# Patient Record
Sex: Male | Born: 1970 | Race: White | Hispanic: No | Marital: Single | Smoking: Never smoker
Health system: Southern US, Community
[De-identification: ages and names within clinical notes are randomized; demographics above are authoritative.]

## PROBLEM LIST (undated history)

## (undated) DIAGNOSIS — T8859XA Other complications of anesthesia, initial encounter: Secondary | ICD-10-CM

## (undated) DIAGNOSIS — K219 Gastro-esophageal reflux disease without esophagitis: Secondary | ICD-10-CM

## (undated) DIAGNOSIS — Z8489 Family history of other specified conditions: Secondary | ICD-10-CM

## (undated) DIAGNOSIS — Z8709 Personal history of other diseases of the respiratory system: Secondary | ICD-10-CM

## (undated) DIAGNOSIS — R064 Hyperventilation: Secondary | ICD-10-CM

## (undated) DIAGNOSIS — M109 Gout, unspecified: Secondary | ICD-10-CM

## (undated) DIAGNOSIS — I10 Essential (primary) hypertension: Secondary | ICD-10-CM

## (undated) DIAGNOSIS — T4145XA Adverse effect of unspecified anesthetic, initial encounter: Secondary | ICD-10-CM

## (undated) HISTORY — PX: ESOPHAGOGASTRODUODENOSCOPY: SHX1529

## (undated) HISTORY — PX: LARYNGOSCOPY: SUR817

## (undated) HISTORY — PX: BRONCHOSCOPY: SUR163

---

## 1997-09-19 ENCOUNTER — Emergency Department (HOSPITAL_COMMUNITY): Admission: EM | Admit: 1997-09-19 | Discharge: 1997-09-19 | Payer: Self-pay | Admitting: Emergency Medicine

## 1999-08-30 ENCOUNTER — Encounter: Admission: RE | Admit: 1999-08-30 | Discharge: 1999-08-30 | Payer: Self-pay | Admitting: Otolaryngology

## 1999-08-30 ENCOUNTER — Encounter: Payer: Self-pay | Admitting: Otolaryngology

## 1999-10-04 ENCOUNTER — Ambulatory Visit (HOSPITAL_COMMUNITY): Admission: RE | Admit: 1999-10-04 | Discharge: 1999-10-04 | Payer: Self-pay | Admitting: Gastroenterology

## 1999-10-17 ENCOUNTER — Ambulatory Visit (HOSPITAL_COMMUNITY): Admission: RE | Admit: 1999-10-17 | Discharge: 1999-10-17 | Payer: Self-pay | Admitting: Gastroenterology

## 2000-10-25 ENCOUNTER — Encounter: Payer: Self-pay | Admitting: Emergency Medicine

## 2000-10-25 ENCOUNTER — Emergency Department (HOSPITAL_COMMUNITY): Admission: EM | Admit: 2000-10-25 | Discharge: 2000-10-25 | Payer: Self-pay | Admitting: Emergency Medicine

## 2001-02-20 ENCOUNTER — Ambulatory Visit: Admission: RE | Admit: 2001-02-20 | Discharge: 2001-02-20 | Payer: Self-pay | Admitting: Internal Medicine

## 2001-05-19 ENCOUNTER — Ambulatory Visit (HOSPITAL_BASED_OUTPATIENT_CLINIC_OR_DEPARTMENT_OTHER): Admission: RE | Admit: 2001-05-19 | Discharge: 2001-05-19 | Payer: Self-pay | Admitting: Otolaryngology

## 2001-05-23 ENCOUNTER — Encounter: Payer: Self-pay | Admitting: Otolaryngology

## 2001-05-23 ENCOUNTER — Encounter: Admission: RE | Admit: 2001-05-23 | Discharge: 2001-05-23 | Payer: Self-pay | Admitting: Otolaryngology

## 2001-10-22 ENCOUNTER — Encounter: Admission: RE | Admit: 2001-10-22 | Discharge: 2001-10-22 | Payer: Self-pay | Admitting: Gastroenterology

## 2001-10-22 ENCOUNTER — Encounter: Payer: Self-pay | Admitting: Gastroenterology

## 2002-11-10 ENCOUNTER — Encounter: Payer: Self-pay | Admitting: Otolaryngology

## 2002-11-10 ENCOUNTER — Encounter: Admission: RE | Admit: 2002-11-10 | Discharge: 2002-11-10 | Payer: Self-pay | Admitting: Otolaryngology

## 2004-02-17 ENCOUNTER — Encounter: Admission: RE | Admit: 2004-02-17 | Discharge: 2004-02-17 | Payer: Self-pay | Admitting: Otolaryngology

## 2005-03-15 ENCOUNTER — Encounter: Admission: RE | Admit: 2005-03-15 | Discharge: 2005-03-15 | Payer: Self-pay | Admitting: Otolaryngology

## 2006-09-06 ENCOUNTER — Encounter: Admission: RE | Admit: 2006-09-06 | Discharge: 2006-09-06 | Payer: Self-pay | Admitting: Otolaryngology

## 2014-04-01 ENCOUNTER — Ambulatory Visit
Admission: RE | Admit: 2014-04-01 | Discharge: 2014-04-01 | Disposition: A | Discharge status: Home / Self Care | Payer: BC Managed Care – PPO | Source: Ambulatory Visit | Attending: Family Medicine | Admitting: Family Medicine

## 2014-04-01 ENCOUNTER — Other Ambulatory Visit: Payer: Self-pay | Admitting: Family Medicine

## 2014-04-01 DIAGNOSIS — R1013 Epigastric pain: Secondary | ICD-10-CM

## 2014-06-07 ENCOUNTER — Other Ambulatory Visit: Payer: Self-pay

## 2014-06-07 ENCOUNTER — Other Ambulatory Visit (HOSPITAL_COMMUNITY): Payer: BC Managed Care – PPO

## 2014-06-07 ENCOUNTER — Encounter (HOSPITAL_COMMUNITY)
Admission: RE | Admit: 2014-06-07 | Discharge: 2014-06-07 | Disposition: A | Discharge status: Home / Self Care | Payer: BLUE CROSS/BLUE SHIELD | Source: Ambulatory Visit | Attending: Otolaryngology | Admitting: Otolaryngology

## 2014-06-07 ENCOUNTER — Encounter (HOSPITAL_COMMUNITY): Payer: Self-pay

## 2014-06-07 ENCOUNTER — Other Ambulatory Visit: Payer: Self-pay | Admitting: Otolaryngology

## 2014-06-07 DIAGNOSIS — R490 Dysphonia: Secondary | ICD-10-CM | POA: Diagnosis not present

## 2014-06-07 DIAGNOSIS — J387 Other diseases of larynx: Secondary | ICD-10-CM | POA: Diagnosis not present

## 2014-06-07 DIAGNOSIS — M109 Gout, unspecified: Secondary | ICD-10-CM | POA: Diagnosis not present

## 2014-06-07 DIAGNOSIS — K219 Gastro-esophageal reflux disease without esophagitis: Secondary | ICD-10-CM | POA: Diagnosis not present

## 2014-06-07 DIAGNOSIS — I10 Essential (primary) hypertension: Secondary | ICD-10-CM | POA: Diagnosis not present

## 2014-06-07 DIAGNOSIS — J398 Other specified diseases of upper respiratory tract: Secondary | ICD-10-CM | POA: Diagnosis not present

## 2014-06-07 DIAGNOSIS — J386 Stenosis of larynx: Secondary | ICD-10-CM | POA: Diagnosis not present

## 2014-06-07 DIAGNOSIS — J3489 Other specified disorders of nose and nasal sinuses: Secondary | ICD-10-CM | POA: Diagnosis not present

## 2014-06-07 DIAGNOSIS — J42 Unspecified chronic bronchitis: Secondary | ICD-10-CM | POA: Diagnosis not present

## 2014-06-07 DIAGNOSIS — M3130 Wegener's granulomatosis without renal involvement: Secondary | ICD-10-CM | POA: Diagnosis not present

## 2014-06-07 HISTORY — DX: Family history of other specified conditions: Z84.89

## 2014-06-07 HISTORY — DX: Gout, unspecified: M10.9

## 2014-06-07 HISTORY — DX: Hyperventilation: R06.4

## 2014-06-07 HISTORY — DX: Adverse effect of unspecified anesthetic, initial encounter: T41.45XA

## 2014-06-07 HISTORY — DX: Other complications of anesthesia, initial encounter: T88.59XA

## 2014-06-07 HISTORY — DX: Gastro-esophageal reflux disease without esophagitis: K21.9

## 2014-06-07 HISTORY — DX: Personal history of other diseases of the respiratory system: Z87.09

## 2014-06-07 HISTORY — DX: Essential (primary) hypertension: I10

## 2014-06-07 NOTE — H&P (Signed)
Fox,  Vincent 44 y.o., male [id number]     Chief Complaint: hoarseness, stridor  HPI: 5 year return visit for this now 44-year-old white male.  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  The diagnosis of Wegener's disease has been considered but never confirmed.  We have attempted to open up his nose previously, unsuccessfully.     Here recently, he was involved with an educational seminar and was video recorded.  Upon watching his playback, he was impressed with the frequency and loudness of his breathing.  This was 3 months ago.  He continues to exercise vigorously, using the exercise bicycle for one full hour and getting his pulse rate up to 150.  He does not ever seem specifically stridorous.  No pain.  No cough.  Here recently, he has noticed a streak of blood occasionally when he clears his throat.  He has seen Dr. Ehinger everal times  and is sent here for further consideration.  A chest x-ray was clear.  He uses Protonix twice daily times years, and also allopurinol twice daily for gout.  No recent trauma to the neck or throat including intubation.  He has been on lisinopril with good control of hypertension per Dr. Ehinger for 2+ years.  In discussing lisinopril with him, he does recognize some similarities with the stretch receptor-type cough which this drug as noted for.  Nothing to suggest angioedema.  No recent upper respiratory infection.  He went scuba diving in the early summer with no difficulty whatsoever.     As we have discussed before, I would expect that eventually this will burn itself out.  He would like to have the nose open someday but recognizes that this may be fruitless if he still has an active process going on.  Preoperative visit.  We are planning on doing microlaryngoscopy with biopsy, possible laser ablation, possible steroid injection, possible balloon dilation of tracheal stenosis.  I discussed this with him  in detail including risks and complications.  Questions were answered and informed consent was obtained.  I gave him a prescription for hydrocodone for pain relief and also cough suppression.  He will continue his antireflux management.  We discussed advancement of diet and activity including return to work.   Since we saw him, he has stopped lisinopril x1 month.  His hypertension is actually better controlled and he feels like his breathing may be 15-20% better.  PMH: Past Medical History  Diagnosis Date  . Complication of anesthesia     slow to wake up  . Family history of adverse reaction to anesthesia     brother postop nausea and vomiting  . Hypertension     has been off of Lisinopril x  6wks   . Labored breathing     just to get air in but no shortness of breath;states this is why he is having this done  . History of bronchitis     late 90's  . Gout     takes Allopurinol daily  . GERD (gastroesophageal reflux disease)     takes Protonix daily    Surg Hx: Past Surgical History  Procedure Laterality Date  . Esophagogastroduodenoscopy    . Laryngoscopy    . Bronchoscopy      FHx:  No family history on file. SocHx:  reports that he has never smoked. He does not have any smokeless tobacco history on file. He reports that he drinks alcohol. He   reports that he does not use illicit drugs.  ALLERGIES: No Known Allergies   (Not in a hospital admission)  No results found for this or any previous visit (from the past 48 hour(s)). No results found.  ROS:Systemic: Not feeling tired (fatigue).  No fever, no night sweats, and no recent weight loss. Head: No headache. Eyes: No eye symptoms. Otolaryngeal: No hearing loss, no earache, no tinnitus, and no purulent nasal discharge.  No nasal passage blockage (stuffiness), no snoring, no sneezing, no hoarseness, and no sore throat. Cardiovascular: No chest pain or discomfort  and no palpitations. Pulmonary: Dyspnea, cough, and  wheezing. Gastrointestinal: No dysphagia  and no heartburn.  No nausea, no abdominal pain, and no melena.  No diarrhea. Genitourinary: No dysuria. Endocrine: No muscle weakness. Musculoskeletal: No calf muscle cramps, no arthralgias, and no soft tissue swelling. Neurological: No dizziness, no fainting, no tingling, and no numbness. Psychological: No anxiety  and no depression. Skin: No rash.  BP:130/76,  HR: 83 b/min,  Height: 5 ft 11 in, Weight: 265 lb , BMI: 37 kg/m2,   PHYSICAL EXAM: He appears healthy.  He is not hoarse or stridorous today.  Anterior nose is severely stenotic on both sides as before.  Oral cavity and pharynx clear.  Neck unremarkable.   Lungs: Clear to auscultation Heart: Regular rate and rhythm without murmurs Abdomen: Soft, active Extremities: Normal configuration Neurologic: Normal, grossly symmetric.  The flexible laryngoscope was introduced with difficulty owing to the severe nasal vestibular stenosis.  I was finally able to pass it down the RIGHT side.  Nasopharynx is clear.  Oral pharynx is clear.  He does have a strong gag reflex.  He has a band of what appears to be immature granulation tissue/scar between the arytenoids above the level of the true cords.  On the LEFT side, this continues to what appears to be a soft eschar on the false cord itself.  The cords are mobile and the airway is good.  What minimal view in the subglottic larynx and trachea I could obtain did not look particularly narrow or stenotic.  No pooling in valleculae or piriforms.    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  You can use the hydrocodone after surgery both for pain relief and also for cough suppression.  I would like to see you back  one month after surgery.  I will call you with the pathology report of course.  Hydrocodone-Acetaminophen 5-325  MG Oral Tablet;1-2 po q4-6h prn pain; Qty20; R0; Rx.  Casey Fye 06/07/2014, 5:49 PM     

## 2014-06-07 NOTE — Pre-Procedure Instructions (Signed)
Vincent Fox  06/07/2014   Your procedure is scheduled on:  Fri, Jan 29 @ 8:30 AM  [facility]  at 6:30 AM.  Call this number if you have problems the morning of surgery: [phone]   Remember:   Do not eat food or drink liquids after midnight.                No Goody's,BC's,Aleve,Aspirin,Ibuprofen,Fish Oil,or any Herbal Medications   Do not wear jewelry  Do not wear lotions, powders, or colognes. You may wear deodorant.  Men may shave face and neck.  Do not bring valuables to the hospital.  [facility] is not responsible                  for any belongings or valuables.               Contacts, dentures or bridgework may not be worn into surgery.  Leave suitcase in the car. After surgery it may be brought to your room.  For patients admitted to the hospital, discharge time is determined by your                treatment team.               Patients discharged the day of surgery will not be allowed to drive  home.    Special Instructions:  [name] - Preparing for Surgery  Before surgery, you can play an important role.  Because skin is not sterile, your skin needs to be as free of germs as possible.  You can reduce the number of germs on you skin by washing with CHG (chlorahexidine gluconate) soap before surgery.  CHG is an antiseptic cleaner which kills germs and bonds with the skin to continue killing germs even after washing.  Please DO NOT use if you have an allergy to CHG or antibacterial soaps.  If your skin becomes reddened/irritated stop using the CHG and inform your nurse when you arrive at Short Stay.  Do not shave (including legs and underarms) for at least 48 hours prior to the first CHG shower.  You may shave your face.  Please follow these instructions carefully:   1.  Shower with CHG Soap the night before surgery and the                                morning of Surgery.  2.  If you choose to wash your hair, wash your hair first as usual with  your       normal shampoo.  3.  After you shampoo, rinse your hair and body thoroughly to remove the                      Shampoo.  4.  Use CHG as you would any other liquid soap.  You can apply chg directly       to the skin and wash gently with scrungie or a clean washcloth.  5.  Apply the CHG Soap to your body ONLY FROM THE NECK DOWN.        Do not use on open wounds or open sores.  Avoid contact with your eyes,       ears, mouth and genitals (private parts).  Wash genitals (private parts)       with your normal soap.  6.  Wash thoroughly, paying special  attention to the area where your surgery        will be performed.  7.  Thoroughly rinse your body with warm water from the neck down.  8.  DO NOT shower/wash with your normal soap after using and rinsing off       the CHG Soap.  9.  Pat yourself dry with a clean towel.            10.  Wear clean pajamas.            11.  Place clean sheets on your bed the night of your first shower and do not        sleep with pets.  Day of Surgery  Do not apply any lotions/deoderants the morning of surgery.  Please wear clean clothes to the hospital/surgery center.     Please read over the following fact sheets that you were given: Pain Booklet, Coughing and Deep Breathing and Surgical Site Infection Prevention

## 2014-06-07 NOTE — Progress Notes (Addendum)
Medical MD is Dr.Robert Ehinger-labs done today at his office-requesting  CXR in epic from 04-01-14  Pt doesn't have a cardiologist  Denies EKG in past year  Denies ever having an echo/stress test/heart cath

## 2014-06-07 NOTE — Progress Notes (Signed)
   06/07/14 1128  OBSTRUCTIVE SLEEP APNEA  Have you ever been diagnosed with sleep apnea through a sleep study? No  Do you snore loudly (loud enough to be heard through closed doors)?  0  Do you often feel tired, fatigued, or sleepy during the daytime? 0  Has anyone observed you stop breathing during your sleep? 0  Do you have, or are you being treated for high blood pressure? 1  BMI more than 35 kg/m2? 1  Age over 46 years old? 0  Neck circumference greater than 40 cm/16 inches? 1 (18)  Gender: 1  Obstructive Sleep Apnea Score 4  Score 4 or greater  Results sent to PCP

## 2014-06-10 MED ORDER — CEFAZOLIN SODIUM 10 G IJ SOLR
3.0000 g | Freq: Once | INTRAMUSCULAR | Status: AC
  Administered 2014-06-11: 3 g via INTRAVENOUS
  Filled 2014-06-10: qty 3000

## 2014-06-10 MED ORDER — DEXAMETHASONE SODIUM PHOSPHATE 10 MG/ML IJ SOLN
8.0000 mg | Freq: Once | INTRAMUSCULAR | Status: AC
  Administered 2014-06-11: 8 mg via INTRAVENOUS
  Filled 2014-06-10: qty 1

## 2014-06-10 MED ORDER — PANTOPRAZOLE SODIUM 40 MG IV SOLR
40.0000 mg | Freq: Once | INTRAVENOUS | Status: DC
  Filled 2014-06-10 (×2): qty 40

## 2014-06-10 NOTE — Anesthesia Preprocedure Evaluation (Addendum)
Anesthesia Evaluation  Patient identified by MRN, date of birth, ID band Patient awake    Reviewed: Allergy & Precautions, NPO status , Patient's Chart, lab work & pertinent test results, reviewed documented beta blocker date and time   Airway        Dental   Pulmonary  Possible tracheal stenosis, CXR OK         Cardiovascular hypertension,  EKG 06/07/2014 occasional PVC, otherwise WNL   Neuro/Psych negative neurological ROS  negative psych ROS   GI/Hepatic Neg liver ROS, GERD-  Medicated,  Endo/Other  negative endocrine ROS  Renal/GU negative Renal ROS     Musculoskeletal negative musculoskeletal ROS (+)   Abdominal   Peds  Hematology negative hematology ROS (+)   Anesthesia Other Findings Hx of nasal obstruction, breathing about 20% better now that he is off lisinopril  Reproductive/Obstetrics                            Anesthesia Physical Anesthesia Plan  ASA: II  Anesthesia Plan: General   Post-op Pain Management:    Induction: Intravenous  Airway Management Planned: Oral ETT  Additional Equipment:   Intra-op Plan:   Post-operative Plan: Extubation in OR  Informed Consent: I have reviewed the patients History and Physical, chart, labs and discussed the procedure including the risks, benefits and alternatives for the proposed anesthesia with the patient or authorized representative who has indicated his/her understanding and acceptance.     Plan Discussed with:   Anesthesia Plan Comments: (Lazer tube available)        Anesthesia Quick Evaluation

## 2014-06-11 ENCOUNTER — Ambulatory Visit (HOSPITAL_COMMUNITY)
Admission: RE | Admit: 2014-06-11 | Discharge: 2014-06-11 | Disposition: A | Discharge status: Home / Self Care | Payer: BLUE CROSS/BLUE SHIELD | Source: Ambulatory Visit | Attending: Otolaryngology | Admitting: Otolaryngology

## 2014-06-11 ENCOUNTER — Ambulatory Visit (HOSPITAL_COMMUNITY): Payer: BLUE CROSS/BLUE SHIELD | Admitting: Anesthesiology

## 2014-06-11 ENCOUNTER — Encounter (HOSPITAL_COMMUNITY): Payer: Self-pay | Admitting: *Deleted

## 2014-06-11 ENCOUNTER — Encounter (HOSPITAL_COMMUNITY)
Admission: RE | Disposition: A | Discharge status: Home / Self Care | Payer: Self-pay | Source: Ambulatory Visit | Attending: Otolaryngology

## 2014-06-11 DIAGNOSIS — J3489 Other specified disorders of nose and nasal sinuses: Secondary | ICD-10-CM | POA: Insufficient documentation

## 2014-06-11 DIAGNOSIS — J386 Stenosis of larynx: Secondary | ICD-10-CM | POA: Diagnosis not present

## 2014-06-11 DIAGNOSIS — R490 Dysphonia: Secondary | ICD-10-CM | POA: Insufficient documentation

## 2014-06-11 DIAGNOSIS — J387 Other diseases of larynx: Secondary | ICD-10-CM | POA: Insufficient documentation

## 2014-06-11 DIAGNOSIS — J42 Unspecified chronic bronchitis: Secondary | ICD-10-CM | POA: Insufficient documentation

## 2014-06-11 DIAGNOSIS — M109 Gout, unspecified: Secondary | ICD-10-CM | POA: Insufficient documentation

## 2014-06-11 DIAGNOSIS — K219 Gastro-esophageal reflux disease without esophagitis: Secondary | ICD-10-CM | POA: Insufficient documentation

## 2014-06-11 DIAGNOSIS — J398 Other specified diseases of upper respiratory tract: Secondary | ICD-10-CM | POA: Insufficient documentation

## 2014-06-11 DIAGNOSIS — M3130 Wegener's granulomatosis without renal involvement: Secondary | ICD-10-CM | POA: Insufficient documentation

## 2014-06-11 DIAGNOSIS — I10 Essential (primary) hypertension: Secondary | ICD-10-CM | POA: Insufficient documentation

## 2014-06-11 HISTORY — PX: MICROLARYNGOSCOPY WITH LASER AND BALLOON DILATION: SHX5973

## 2014-06-11 LAB — CBC
HCT: 40.8 % (ref 39.0–52.0)
Hemoglobin: 13.6 g/dL (ref 13.0–17.0)
MCH: 28.5 pg (ref 26.0–34.0)
MCHC: 33.3 g/dL (ref 30.0–36.0)
MCV: 85.4 fL (ref 78.0–100.0)
PLATELETS: 198 10*3/uL (ref 150–400)
RBC: 4.78 MIL/uL (ref 4.22–5.81)
RDW: 13.1 % (ref 11.5–15.5)
WBC: 5.7 10*3/uL (ref 4.0–10.5)

## 2014-06-11 SURGERY — MICROLARYNGOSCOPY WITH LASER AND BALLOON DILATION
Anesthesia: General

## 2014-06-11 MED ORDER — EPINEPHRINE HCL 1 MG/ML IJ SOLN
INTRAMUSCULAR | Status: AC
  Filled 2014-06-11: qty 1

## 2014-06-11 MED ORDER — TRIAMCINOLONE ACETONIDE 40 MG/ML IJ SUSP
INTRAMUSCULAR | Status: AC
  Filled 2014-06-11: qty 5

## 2014-06-11 MED ORDER — PROPOFOL INFUSION 10 MG/ML OPTIME
INTRAVENOUS | Status: DC | PRN
  Administered 2014-06-11: 100 ug/kg/min via INTRAVENOUS

## 2014-06-11 MED ORDER — FENTANYL CITRATE 0.05 MG/ML IJ SOLN
INTRAMUSCULAR | Status: DC | PRN
  Administered 2014-06-11 (×2): 50 ug via INTRAVENOUS
  Administered 2014-06-11: 100 ug via INTRAVENOUS
  Administered 2014-06-11: 50 ug via INTRAVENOUS

## 2014-06-11 MED ORDER — PROPOFOL 10 MG/ML IV BOLUS
INTRAVENOUS | Status: DC | PRN
  Administered 2014-06-11: 180 mg via INTRAVENOUS

## 2014-06-11 MED ORDER — PROPOFOL 10 MG/ML IV BOLUS
INTRAVENOUS | Status: AC
  Filled 2014-06-11: qty 20

## 2014-06-11 MED ORDER — FENTANYL CITRATE 0.05 MG/ML IJ SOLN
INTRAMUSCULAR | Status: AC
  Filled 2014-06-11: qty 5

## 2014-06-11 MED ORDER — LACTATED RINGERS IV SOLN
INTRAVENOUS | Status: DC
  Administered 2014-06-11: 08:00:00 via INTRAVENOUS

## 2014-06-11 MED ORDER — MIDAZOLAM HCL 5 MG/5ML IJ SOLN
INTRAMUSCULAR | Status: DC | PRN
  Administered 2014-06-11 (×2): 1 mg via INTRAVENOUS

## 2014-06-11 MED ORDER — MIDAZOLAM HCL 2 MG/2ML IJ SOLN
INTRAMUSCULAR | Status: AC
  Filled 2014-06-11: qty 2

## 2014-06-11 MED ORDER — OXYMETAZOLINE HCL 0.05 % NA SOLN
NASAL | Status: AC
  Filled 2014-06-11: qty 15

## 2014-06-11 MED ORDER — SUCCINYLCHOLINE CHLORIDE 20 MG/ML IJ SOLN
INTRAMUSCULAR | Status: DC | PRN
  Administered 2014-06-11: 25 mg via INTRAVENOUS
  Administered 2014-06-11: 100 mg via INTRAVENOUS
  Administered 2014-06-11: 25 mg via INTRAVENOUS

## 2014-06-11 MED ORDER — LACTATED RINGERS IV SOLN
INTRAVENOUS | Status: DC | PRN
  Administered 2014-06-11 (×2): via INTRAVENOUS

## 2014-06-11 MED ORDER — FENTANYL CITRATE 0.05 MG/ML IJ SOLN: 25.0000 ug | INTRAMUSCULAR | Status: DC | PRN

## 2014-06-11 MED ORDER — PROMETHAZINE HCL 25 MG/ML IJ SOLN: 6.2500 mg | INTRAMUSCULAR | Status: DC | PRN

## 2014-06-11 MED ORDER — OXYMETAZOLINE HCL 0.05 % NA SOLN
NASAL | Status: DC | PRN
  Administered 2014-06-11: 1

## 2014-06-11 MED ORDER — TRIAMCINOLONE ACETONIDE 40 MG/ML IJ SUSP
INTRAMUSCULAR | Status: DC | PRN
  Administered 2014-06-11: 200 mg

## 2014-06-11 MED ORDER — ONDANSETRON HCL 4 MG/2ML IJ SOLN
INTRAMUSCULAR | Status: DC | PRN
  Administered 2014-06-11: 4 mg via INTRAVENOUS

## 2014-06-11 MED ORDER — LIDOCAINE HCL (CARDIAC) 20 MG/ML IV SOLN
INTRAVENOUS | Status: AC
  Filled 2014-06-11: qty 5

## 2014-06-11 MED ORDER — 0.9 % SODIUM CHLORIDE (POUR BTL) OPTIME
TOPICAL | Status: DC | PRN
  Administered 2014-06-11: 1000 mL

## 2014-06-11 MED ORDER — MEPERIDINE HCL 25 MG/ML IJ SOLN: 6.2500 mg | INTRAMUSCULAR | Status: DC | PRN

## 2014-06-11 MED ORDER — LIDOCAINE HCL (CARDIAC) 20 MG/ML IV SOLN
INTRAVENOUS | Status: DC | PRN
  Administered 2014-06-11: 50 mg via INTRAVENOUS

## 2014-06-11 SURGICAL SUPPLY — 36 items
BIT DRILL QC 2.7MM (BIT) ×1 IMPLANT
CANISTER SUCT 1200ML W/VALVE (MISCELLANEOUS) IMPLANT
COVER BACK TABLE 80X110 HD (DRAPES) ×3 IMPLANT
COVER MAYO STAND STRL (DRAPES) ×3 IMPLANT
DRAPE PROXIMA HALF (DRAPES) ×3 IMPLANT
GAUZE SPONGE 2X2 8PLY STRL LF (GAUZE/BANDAGES/DRESSINGS) ×1 IMPLANT
GAUZE SPONGE 4X4 16PLY XRAY LF (GAUZE/BANDAGES/DRESSINGS) ×3 IMPLANT
GLOVE BIO SURGEON STRL SZ7.5 (GLOVE) ×3 IMPLANT
GLOVE BIO SURGEON STRL SZ8 (GLOVE) ×3 IMPLANT
GLOVE BIOGEL PI IND STRL 7.5 (GLOVE) ×1 IMPLANT
GLOVE BIOGEL PI IND STRL 8 (GLOVE) ×1 IMPLANT
GLOVE BIOGEL PI INDICATOR 7.5 (GLOVE) ×2
GLOVE BIOGEL PI INDICATOR 8 (GLOVE) ×2
GLOVE ECLIPSE 8.0 STRL XLNG CF (GLOVE) ×3 IMPLANT
GOWN STRL REUS W/ TWL XL LVL3 (GOWN DISPOSABLE) ×2 IMPLANT
GOWN STRL REUS W/TWL 2XL LVL3 (GOWN DISPOSABLE) ×3 IMPLANT
GOWN STRL REUS W/TWL XL LVL3 (GOWN DISPOSABLE) ×4
GUARD TEETH (MISCELLANEOUS) ×3 IMPLANT
MARKER SKIN DUAL TIP RULER LAB (MISCELLANEOUS) IMPLANT
NEEDLE 1/2 CIR CATGUT .05X1.09 (NEEDLE) IMPLANT
NEEDLE 18GX1X1/2 (RX/OR ONLY) (NEEDLE) ×3 IMPLANT
NEEDLE SPNL 22GX3.5 QUINCKE BK (NEEDLE) IMPLANT
NEEDLE TRANS ORAL INJECTION (NEEDLE) IMPLANT
NS IRRIG 1000ML POUR BTL (IV SOLUTION) ×3 IMPLANT
PAD EYE OVAL STERILE LF (GAUZE/BANDAGES/DRESSINGS) IMPLANT
PATTIES SURGICAL .5 X3 (DISPOSABLE) ×3 IMPLANT
PROS DRILL BIT QC 2.7MM (BIT) ×3
SOLUTION ANTI FOG 6CC (MISCELLANEOUS) ×3 IMPLANT
SOLUTION BUTLER CLEAR DIP (MISCELLANEOUS) ×3 IMPLANT
SPONGE GAUZE 2X2 STER 10/PKG (GAUZE/BANDAGES/DRESSINGS) ×2
SPONGE GAUZE 4X4 16PLY UNSTER (WOUND CARE) IMPLANT
SYR 5ML LL (SYRINGE) ×3 IMPLANT
SYR INFLATE BILIARY GAUGE (MISCELLANEOUS) ×3 IMPLANT
TOWEL OR 17X24 6PK STRL BLUE (TOWEL DISPOSABLE) ×3 IMPLANT
TUBE CONNECTING 20'X1/4 (TUBING) ×1
TUBE CONNECTING 20X1/4 (TUBING) ×2 IMPLANT

## 2014-06-11 NOTE — Anesthesia Postprocedure Evaluation (Signed)
  Anesthesia Post-op Note  Patient: Vincent Fox  Procedure(s) Performed: Procedure(s): MICROLARYNGOSCOPY DIRECT WITH LASER/BRONCHOSCOPY AND  BALLOON DILATION (N/A)  Patient Location: PACU  Anesthesia Type:General  Level of Consciousness: awake and alert   Airway and Oxygen Therapy: Patient Spontanous Breathing and Patient connected to nasal cannula oxygen  Post-op Pain: mild  Post-op Assessment: Post-op Vital signs reviewed and Patient's Cardiovascular Status Stable  Post-op Vital Signs: Reviewed and stable  Last Vitals:  Filed Vitals:   06/11/14 0644  BP: 153/105  Pulse: 86  Temp: 36.4 C  Resp: 20    Complications: No apparent anesthesia complications

## 2014-06-11 NOTE — H&P (View-Only) (Signed)
Vincent Fox,  Vincent Fox 44 y.o., male [id number]     Chief Complaint: hoarseness, stridor  HPI: 5 year return visit for this now 44 year old white male.  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  The diagnosis of Wegener's disease has been considered but never confirmed.  We have attempted to open up his nose previously, unsuccessfully.     Here recently, he was involved with an Conservation officer, historic buildingseducational seminar and was video recorded.  Upon watching his playback, he was impressed with the frequency and loudness of his breathing.  This was 3 months ago.  He continues to exercise vigorously, using the exercise bicycle for one full hour and getting his pulse rate up to 150.  He does not ever seem specifically stridorous.  No pain.  No cough.  Here recently, he has noticed a streak of blood occasionally when he clears his throat.  He has seen Dr. Manus Fox everal times  and is sent here for further consideration.  A chest x-ray was clear.  He uses Protonix twice daily times years, and also allopurinol twice daily for gout.  No recent trauma to the neck or throat including intubation.  He has been on lisinopril with good control of hypertension per Dr. Manus Fox for 2+ years.  In discussing lisinopril with him, he does recognize some similarities with the stretch receptor-type cough which this drug as noted for.  Nothing to suggest angioedema.  No recent upper respiratory infection.  He went scuba diving in the early summer with no difficulty whatsoever.     As we have discussed before, I would expect that eventually this will burn itself out.  He would like to have the nose open someday but recognizes that this may be fruitless if he still has an active process going on.  Preoperative visit.  We are planning on doing microlaryngoscopy with biopsy, possible laser ablation, possible steroid injection, possible balloon dilation of tracheal stenosis.  I discussed this with him  in detail including risks and complications.  Questions were answered and informed consent was obtained.  I gave him a prescription for hydrocodone for pain relief and also cough suppression.  He will continue his antireflux management.  We discussed advancement of diet and activity including return to work.   Since we saw him, he has stopped lisinopril x1 month.  His hypertension is actually better controlled and he feels like his breathing may be 15-20% better.  PMH: Past Medical History  Diagnosis Date  . Complication of anesthesia     slow to wake up  . Family history of adverse reaction to anesthesia     brother postop nausea and vomiting  . Hypertension     has been off of Lisinopril x  6wks   . Labored breathing     just to get air in but no shortness of breath;states this is why he is having this done  . History of bronchitis     late 90's  . Gout     takes Allopurinol daily  . GERD (gastroesophageal reflux disease)     takes Protonix daily    Surg Hx: Past Surgical History  Procedure Laterality Date  . Esophagogastroduodenoscopy    . Laryngoscopy    . Bronchoscopy      FHx:  No family history on file. SocHx:  reports that he has never smoked. He does not have any smokeless tobacco history on file. He reports that he drinks alcohol. He  reports that he does not use illicit drugs.  ALLERGIES: No Known Allergies   (Not in a hospital admission)  No results found for this or any previous visit (from the past 48 hour(s)). No results found.  [id number]: Not feeling tired (fatigue).  No fever, no night sweats, and no recent weight loss. Head: No headache. Eyes: No eye symptoms. Otolaryngeal: No hearing loss, no earache, no tinnitus, and no purulent nasal discharge.  No nasal passage blockage (stuffiness), no snoring, no sneezing, no hoarseness, and no sore throat. Cardiovascular: No chest pain or discomfort  and no palpitations. Pulmonary: Dyspnea, cough, and  wheezing. Gastrointestinal: No dysphagia  and no heartburn.  No nausea, no abdominal pain, and no melena.  No diarrhea. Genitourinary: No dysuria. Endocrine: No muscle weakness. Musculoskeletal: No calf muscle cramps, no arthralgias, and no soft tissue swelling. Neurological: No dizziness, no fainting, no tingling, and no numbness. Psychological: No anxiety  and no depression. Skin: No rash.  BP:130/76,  HR: 83 b/min,  Height: 5 ft 11 in, Weight: 265 lb , BMI: 37 kg/m2,   PHYSICAL EXAM: He appears healthy.  He is not hoarse or stridorous today.  Anterior nose is severely stenotic on both sides as before.  Oral cavity and pharynx clear.  Neck unremarkable.   Lungs: Clear to auscultation Heart: Regular rate and rhythm without murmurs Abdomen: Soft, active Extremities: Normal configuration Neurologic: Normal, grossly symmetric.  The flexible laryngoscope was introduced with difficulty owing to the severe nasal vestibular stenosis.  I was finally able to pass it down the RIGHT side.  Nasopharynx is clear.  Oral pharynx is clear.  He does have a strong gag reflex.  He has a band of what appears to be immature granulation tissue/scar between the arytenoids above the level of the true cords.  On the LEFT side, this continues to what appears to be a soft eschar on the false cord itself.  The cords are mobile and the airway is good.  What minimal view in the subglottic larynx and trachea I could obtain did not look particularly narrow or stenotic.  No pooling in valleculae or piriforms.    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  You can use the hydrocodone after surgery both for pain relief and also for cough suppression.  I would like to see you back  one month after surgery.  I will call you with the pathology report of course.  Hydrocodone-Acetaminophen 5-325  MG Oral Tablet;1-2 po q4-6h prn pain; Qty20; R0; Rx.  Vincent Fox 06/07/2014, 5:49 PM

## 2014-06-11 NOTE — Op Note (Signed)
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  Tracheal stenosis approximately 60% of normal diameter (8 mm) 4 cm below the vocal cords.  Procedure: With the patient in a comfortable supine position, general mask and intravenous anesthesia was induced without difficulty.  At an appropmriate level, the table was turned 90 degrees away from Anesthesia.  A clean preparation and draping was performed in the standard fashion.  A rubber tooth guard was placed.   taking care to protect lips and teeth a laser laryngoscope was introduced but was too large to pass into the supraglottis and was removed.  The anterior commissure laryngoscope was introduced into the supraglottis and suspended in the standard fashion.    The findings were as described above. Kenalog solution, 40 mg per mL was injected into the posterior commissure web.  The rigid bronchoscopic telescope was introduced through the laryngoscope and passed into the upper trachea with the findings as described above.  the membranous stenosis was injected with additional Kenalog solution.    All personnel in the room had protective eye gear. The laser had been previously tested for aim and function.  The patient was draped in saline saturated towels including saline saturated eye pads.  Using the laser at a 10 W single pulse setting, the posterior web was incised in the midline. This was carried down to the level of the posterior tracheal wall. A biopsy was taken from the mucosa and sent for permanent interpretation. An Afrin pledget was  applied against the raw surfaces for hemostasis and allowed to remain in place for several minutes.  The pledget was removed and hemostasis was observed.  Bronchoscopic telescope was reintroduced and the stenosis was again examined and measured. A 15 mm balloon was applied at the stenosis and inflated to 7  Atmospheres.  It did develop a leak  and was removed. The stenosis was felt to have been adequately dilated and this was not repeated.  There was no bleeding at this site.  Small amount of blood was suctioned from the inferior trachea which was clear all the way to the level of the carina.  The posterior commissure was once again hemostatic.  The laryngoscope was then suspended and removed.  At this point the procedure was completed.  Dental status was intact.  The patient was returned to Anesthesia, awakened, extubated, and transferred to PACU in satisfactory condition.   Dispo:   PACU to home  Plan:  Moisture, hydration, antireflux measures, hydrocodone for pain and/or cough suppression.  Cephus RicherWOLICKI,  Capitola Ladson T MD

## 2014-06-11 NOTE — Progress Notes (Signed)
Report given to angel britt rn as cargiver 

## 2014-06-11 NOTE — Transfer of Care (Signed)
Immediate Anesthesia Transfer of Care Note  Patient: Vincent Fox L Derick  Procedure(s) Performed: Procedure(s): MICROLARYNGOSCOPY DIRECT WITH LASER/BRONCHOSCOPY AND  BALLOON DILATION (N/A)  Patient Location: PACU  Anesthesia Type:General  Level of Consciousness: awake, alert  and oriented  Airway & Oxygen Therapy: Patient Spontanous Breathing and Patient connected to face mask oxygen  Post-op Assessment: Report given to RN and Post -op Vital signs reviewed and stable  Post vital signs: Reviewed and stable  Last Vitals:  Filed Vitals:   06/11/14 0644  BP: 153/105  Pulse: 86  Temp: 36.4 C  Resp: 20    Complications: No apparent anesthesia complications

## 2014-06-11 NOTE — Discharge Instructions (Signed)
Advance diet and activity as comfortable Keep your environment moist. Breathe the hot steam in the shower 3-4 times daily for 15 minutes at a time and as desired.  Drink plenty of fluids Hydrocodone for pain relief and also for cough suppression as needed. Avoid heavy coughing Continue anti reflux medication twice daily before meals Recheck my office 3 weeks please, [phone] Call for bleeding, breathing problems, excessive pain.

## 2014-06-11 NOTE — Interval H&P Note (Signed)
History and Physical Interval Note:  06/11/2014 8:28 AM  Vincent Fox  has presented today for surgery, with the diagnosis of laryngeal stenosis  The various methods of treatment have been discussed with the patient and family. After consideration of risks, benefits and other options for treatment, the patient has consented to  Procedure(s): MICROLARYNGOSCOPY DIRECT WITH LASER/BRONCHOSCOPY AND POSSIBLE BALLOON DILATION (N/A) as a surgical intervention .  The patient's history has been re-reviewed, patient re-examined, no change in status, stable for surgery.  I have re-reviewed the patient's chart and labs.  Questions were answered to the patient's satisfaction.     Flo ShanksWOLICKI, Ashritha [place]rosiers

## 2014-06-14 ENCOUNTER — Encounter (HOSPITAL_COMMUNITY): Payer: Self-pay | Admitting: Otolaryngology

## 2014-06-14 NOTE — Progress Notes (Signed)
Patient has pain in lower leg after anesthesia.

## 2015-04-17 IMAGING — CR DG CHEST 2V
2 series · 2 of 2 positions shown · non-contrast
Comparison: 09/06/2006

CLINICAL DATA: Dyspepsia. Dyspnea for 4 months. History of
Oncet disease.

EXAM:
CHEST  2 VIEW

[w chest pa]
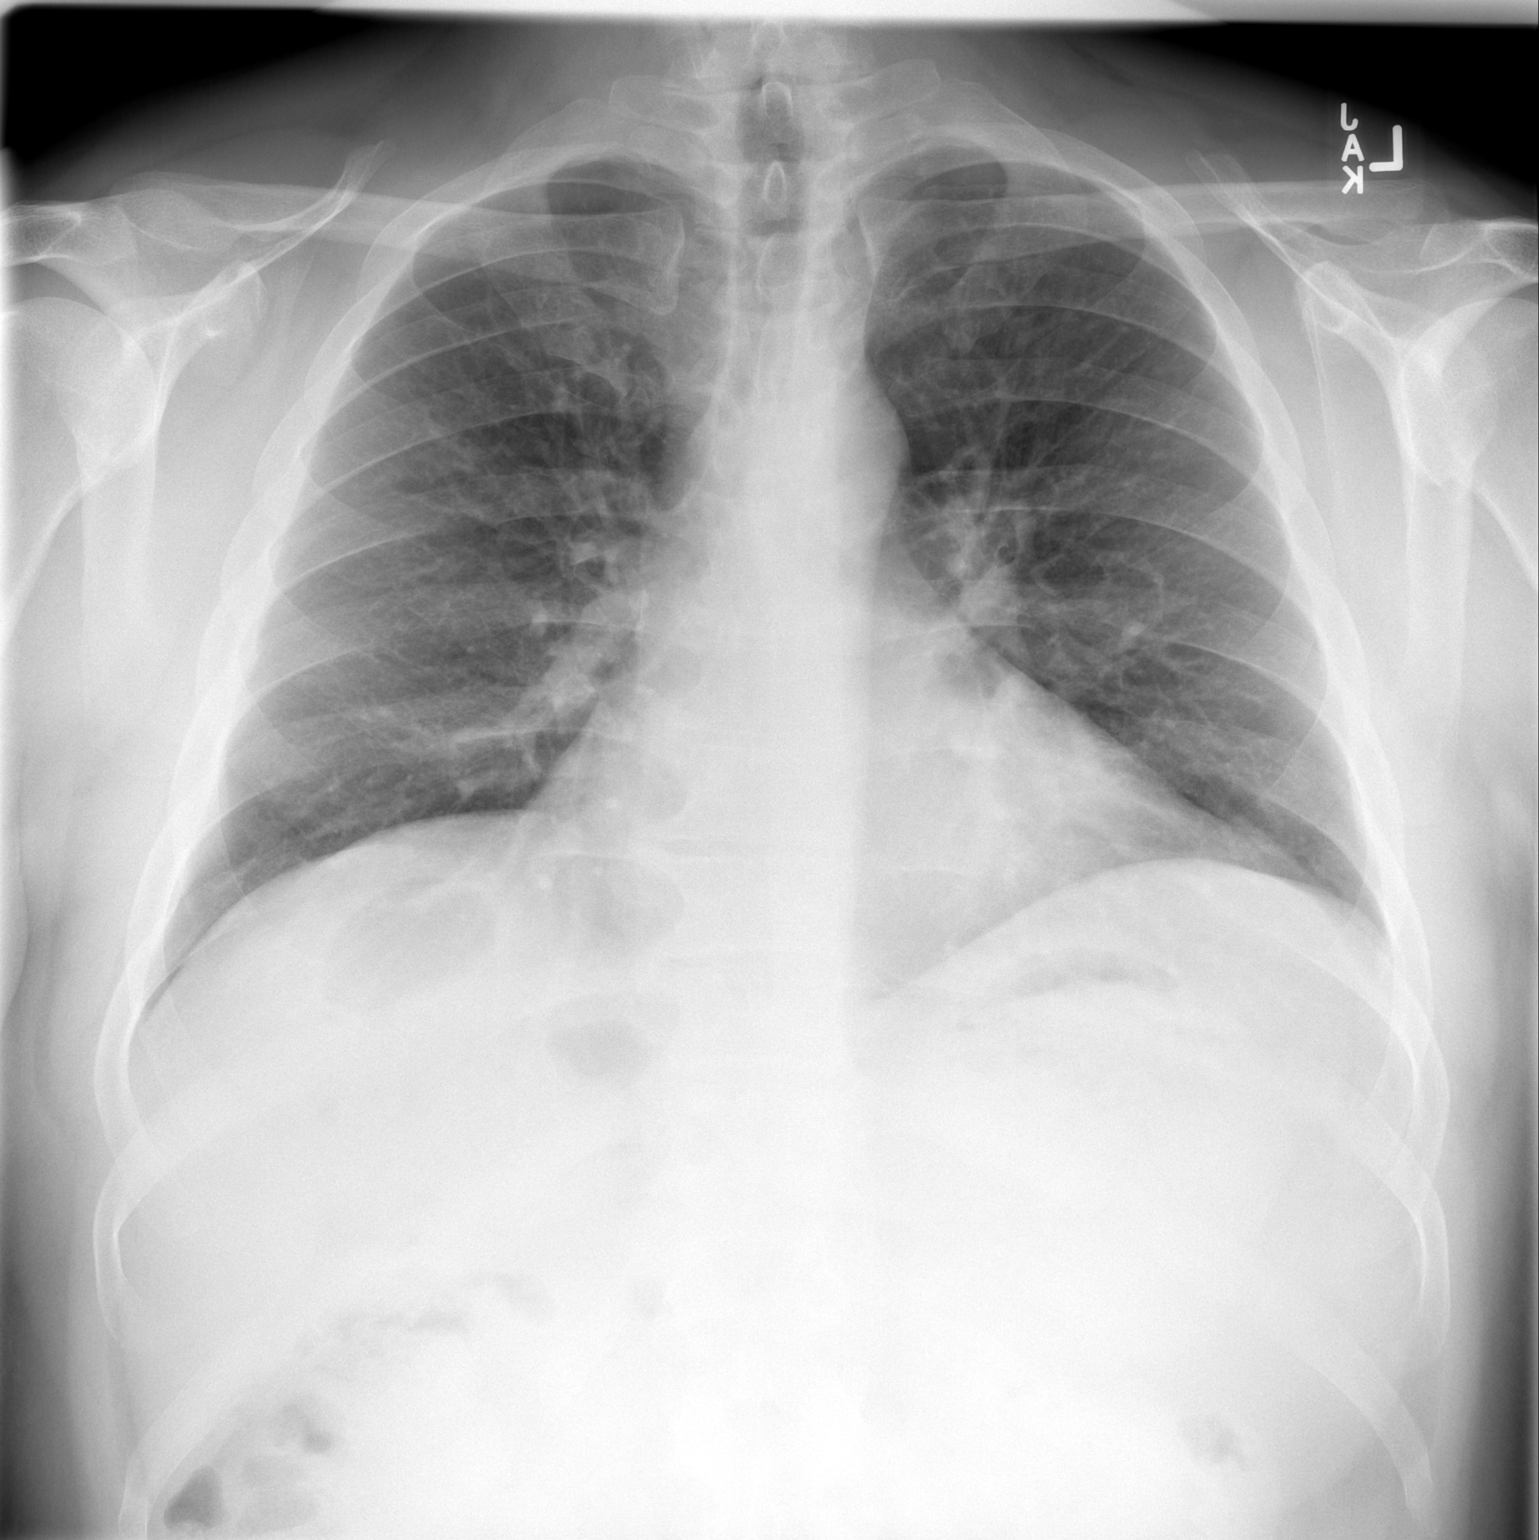

[w chest lat]
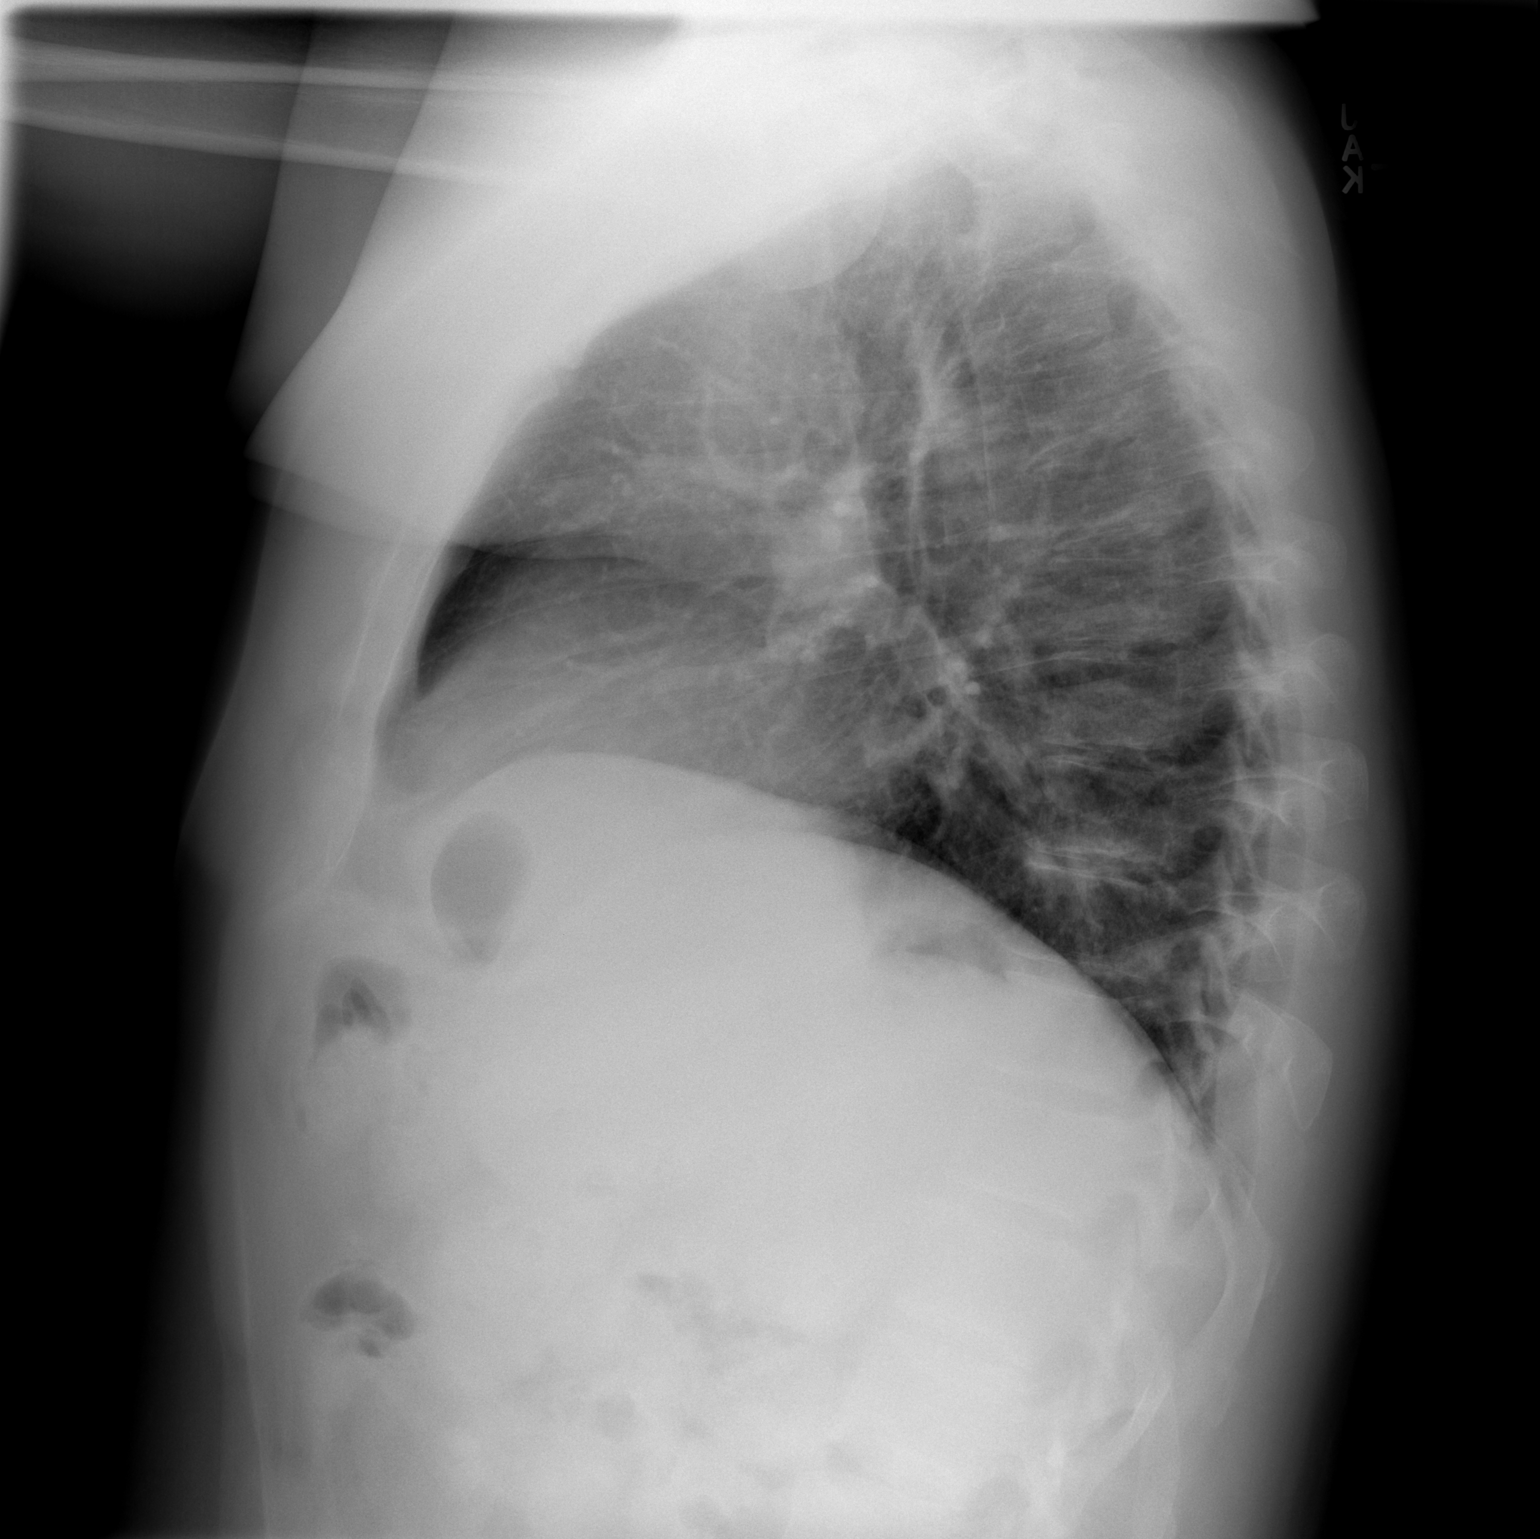

[2 of 2 positions shown; findings below may reference images not displayed]

FINDINGS: The cardiomediastinal silhouette is unchanged and within normal
limits. Lungs are mildly hypoinflated. No airspace consolidation,
edema, pleural effusion, or pneumothorax is identified. No acute
osseous abnormality is identified.
IMPRESSION: No active cardiopulmonary disease.

## 2020-02-01 ENCOUNTER — Encounter (HOSPITAL_BASED_OUTPATIENT_CLINIC_OR_DEPARTMENT_OTHER): Payer: Self-pay

## 2020-02-01 DIAGNOSIS — G479 Sleep disorder, unspecified: Secondary | ICD-10-CM

## 2020-03-05 ENCOUNTER — Other Ambulatory Visit: Payer: Self-pay

## 2020-03-05 ENCOUNTER — Ambulatory Visit (HOSPITAL_BASED_OUTPATIENT_CLINIC_OR_DEPARTMENT_OTHER): Payer: No Typology Code available for payment source | Attending: Otolaryngology | Admitting: Internal Medicine

## 2020-03-05 VITALS — Ht 72.0 in | Wt 275.0 lb

## 2020-03-05 DIAGNOSIS — G4761 Periodic limb movement disorder: Secondary | ICD-10-CM | POA: Insufficient documentation

## 2020-03-05 DIAGNOSIS — G4733 Obstructive sleep apnea (adult) (pediatric): Secondary | ICD-10-CM | POA: Insufficient documentation

## 2020-03-05 DIAGNOSIS — G479 Sleep disorder, unspecified: Secondary | ICD-10-CM

## 2020-03-19 DIAGNOSIS — G479 Sleep disorder, unspecified: Secondary | ICD-10-CM | POA: Diagnosis not present

## 2020-03-19 NOTE — Procedures (Signed)
    Patient Name: Vincent Fox, Vincent Fox Date: 03/05/2020 Gender: Male D.O.B: [birth year] Age (years): 49 Referring Provider: Serena Colonel Height (inches): 72 Interpreting Physician: Jetty Duhamel MD, ABSM Weight (lbs): 275 RPSGT: Rolene Arbour BMI: 37 MRN: [id number] Neck Size: 16.75  CLINICAL INFORMATION Sleep Study Type: NPSG Indication for sleep study: OSA Epworth Sleepiness Score: 5  SLEEP STUDY TECHNIQUE As per the AASM Manual for the Scoring of Sleep and Associated Events v2.3 (April 2016) with a hypopnea requiring 4% desaturations.  The channels recorded and monitored were frontal, central and occipital EEG, electrooculogram (EOG), submentalis EMG (chin), nasal and oral airflow, thoracic and abdominal wall motion, anterior tibialis EMG, snore microphone, electrocardiogram, and pulse oximetry.  MEDICATIONS Medications self-administered by patient taken the night of the study : none reported  SLEEP ARCHITECTURE The study was initiated at 10:17:51 PM and ended at 4:43:22 AM.  Sleep onset time was 45.4 minutes and the sleep efficiency was 39.5%%. The total sleep time was 152.1 minutes.  Stage REM latency was 257.0 minutes.  The patient spent 6.7%% of the night in stage N1 sleep, 79.9%% in stage N2 sleep, 0.0%% in stage N3 and 13.5% in REM.  Alpha intrusion was absent.  Supine sleep was 6.24%.  RESPIRATORY PARAMETERS The overall apnea/hypopnea index (AHI) was 15.4 per hour. There were 5 total apneas, including 5 obstructive, 0 central and 0 mixed apneas. There were 34 hypopneas and 25 RERAs.  The AHI during Stage REM sleep was 14.6 per hour.  AHI while supine was 0.0 per hour.  The mean oxygen saturation was 93.9%. The minimum SpO2 during sleep was 85.0%.  moderate snoring was noted during this study.  CARDIAC DATA The 2 lead EKG demonstrated sinus rhythm. The mean heart rate was 57.3 beats per minute. Other EKG findings include: None.  LEG MOVEMENT DATA The  total PLMS were 0 with a resulting PLMS index of 0.0. Associated arousal with leg movement index was 6.3 .  IMPRESSIONS - Moderate obstructive sleep apnea occurred during this study (AHI = 15.4/h). - No significant central sleep apnea occurred during this study (CAI = 0.0/h). - Mild oxygen desaturation was noted during this study (Min O2 = 85.0%). Mean sat 94.4%. - The patient snored with moderate snoring volume. - No cardiac abnormalities were noted during this study. - Limb movement with arousal averaged 6.3/ hr. - Technician commented on loud nasal sounds blamed on nasal congesstion, mouth breathing and 5 bathroom trips.   DIAGNOSIS - Obstructive Sleep Apnea (G47.33) - Periodic Limb Movement During Sleep (G47.61)  RECOMMENDATIONS - Suggest CPAP, and/ or ENT measures to improve nasal airway. - Consider trial of Mirapex, Requip, or Sinemet for treatment of Periodic Leg Movements of Sleep, if appropriate.  - Be careful with alcohol, sedatives and other CNS depressants that may worsen sleep apnea and disrupt normal sleep architecture. - Sleep hygiene should be reviewed to assess factors that may improve sleep quality. - Weight management and regular exercise should be initiated or continued if appropriate.  [Electronically signed] 03/19/2020 04:25 PM  Jetty Duhamel MD, ABSM Diplomate, American Board of Sleep Medicine   NPI: [id number]                        Jetty Duhamel Diplomate, American Board of Sleep Medicine  ELECTRONICALLY SIGNED ON:  03/19/2020, 4:15 PM [facility] PH: [phone]   FX: [phone] ACCREDITED BY THE AMERICAN ACADEMY OF SLEEP MEDICINE
